# Patient Record
Sex: Male | Born: 1980 | Race: Black or African American | Hispanic: No | Marital: Single | Smoking: Current every day smoker
Health system: Southern US, Community
[De-identification: ages and names within clinical notes are randomized; demographics above are authoritative.]

---

## 2008-10-10 ENCOUNTER — Emergency Department (HOSPITAL_COMMUNITY): Admission: EM | Admit: 2008-10-10 | Discharge: 2008-10-10 | Payer: Self-pay | Admitting: Emergency Medicine

## 2011-02-08 ENCOUNTER — Emergency Department (HOSPITAL_COMMUNITY)
Admission: EM | Admit: 2011-02-08 | Discharge: 2011-02-08 | Disposition: A | Discharge status: Home / Self Care | Payer: No Typology Code available for payment source | Attending: Emergency Medicine | Admitting: Emergency Medicine

## 2011-02-08 ENCOUNTER — Encounter: Payer: Self-pay | Admitting: Emergency Medicine

## 2011-02-08 DIAGNOSIS — S335XXA Sprain of ligaments of lumbar spine, initial encounter: Secondary | ICD-10-CM | POA: Insufficient documentation

## 2011-02-08 DIAGNOSIS — F17200 Nicotine dependence, unspecified, uncomplicated: Secondary | ICD-10-CM | POA: Insufficient documentation

## 2011-02-08 DIAGNOSIS — Y92410 Unspecified street and highway as the place of occurrence of the external cause: Secondary | ICD-10-CM | POA: Insufficient documentation

## 2011-02-08 DIAGNOSIS — IMO0002 Reserved for concepts with insufficient information to code with codable children: Secondary | ICD-10-CM

## 2011-02-08 MED ORDER — OXYCODONE-ACETAMINOPHEN 5-325 MG PO TABS: 1.0000 | ORAL_TABLET | Freq: Four times a day (QID) | ORAL | Status: AC | PRN

## 2011-02-08 MED ORDER — IBUPROFEN 600 MG PO TABS: 600.0000 mg | ORAL_TABLET | Freq: Three times a day (TID) | ORAL | Status: AC | PRN

## 2011-02-08 MED ORDER — OXYCODONE-ACETAMINOPHEN 5-325 MG PO TABS
2.0000 | ORAL_TABLET | Freq: Once | ORAL | Status: AC
  Administered 2011-02-08: 2 via ORAL
  Filled 2011-02-08: qty 2

## 2011-02-08 MED ORDER — CYCLOBENZAPRINE HCL 10 MG PO TABS
10.0000 mg | ORAL_TABLET | Freq: Once | ORAL | Status: AC
  Administered 2011-02-08: 10 mg via ORAL
  Filled 2011-02-08: qty 1

## 2011-02-08 MED ORDER — CYCLOBENZAPRINE HCL 10 MG PO TABS: 10.0000 mg | ORAL_TABLET | Freq: Three times a day (TID) | ORAL | Status: AC | PRN

## 2011-02-08 NOTE — Discharge Instructions (Signed)
Back Pain & Injury Your back pain is most likely caused by a strain of the muscles or ligaments supporting the spine. Back strains cause pain and trouble moving because of muscle spasms. They may take several weeks to heal. Usually they are better in days.  Treatment for back pain includes:  Rest - Get bed rest as needed over the next day or two. Use a firm mattress and lie on your side with your knees slightly bent. If you lie on your back, put a pillow under your knees.   Early movement - Back pain improves most rapidly if you remain active. It is much more stressful on the back to sit or stand in one place. Do not sit, drive or stand in one place for more than 30 minutes at a time. Take short walks on level surfaces as soon as pain allows.   Limit bending and lifting - Do not bend over or lift anything over 20 pounds until instructed otherwise. Lift by bending your knees. Use your leg muscles to help. Keep the load close to your body and avoid twisting. Do not reach or do overhead work.   Medicines - Medicine to reduce pain and inflammation are helpful. Muscle-relaxing drugs may be prescribed.   Therapy - Put ice packs on your back every few hours for the first 2-3 days after your injury or as instructed. After that ice or heat may be alternated to reduce pain and spasm. Back exercises and gentle massage may be of some benefit. You should be examined again if your back pain is not better in one week.  SEEK IMMEDIATE MEDICAL CARE IF:  You have pain that radiates from your back into your legs.   You develop new bowel or bladder control problems.   You have unusual weakness or numbness in your arms or legs.   You develop nausea or vomiting.   You develop abdominal pain.   You feel faint.  Document Released: 06/02/2005 Document Re-Released: 03/11/2008 [facility] Patient Information 2011 [place], [place].  Back Exercises  Back exercises help treat and prevent back injuries. The goal of back  exercises is to increase the strength of your abdominal and back muscles and the flexibility of your back. These exercises should be started when you no longer have back pain. Back exercises include: 1. Pelvic Tilt - Lie on your back with your knees bent. Tilt your pelvis until the lower part of your back is against the floor. Hold this position 5-10 sec and repeat 5-10 times.  2. Knee to Chest - Pull first one knee up against your chest and hold for 20-30 seconds, repeat this with the other knee, and then both knees. This may be done with the other leg straight or bent, whichever feels better.  3. Sit-Ups or Curl-Ups - Bend your knees 90 degrees. Start with tilting your pelvis, and do a partial, slow sit-up, lifting your trunk only 30-45 degrees off the floor. Take at least 2-3 sec for each sit-up. Do not do sit-ups with your knees out straight. If partial sit-ups are difficult, simply do the above but with only tightening your abdominal muscles and holding it as directed.  4. Hip-Lift - Lie on your back with your knees flexed 90 degrees. Push down with your feet and shoulders as you raise your hips a couple inches off the floor; hold for 10 sec, repeat 5-10 times.  5. Back arches - Lie on your stomach, propping yourself up on bent elbows. Slowly  press on your hands, causing an arch in your low back. Repeat 3-5 times. Any initial stiffness and discomfort should lessen with repetition over time.  6. Shoulder-Lifts - Lie face down with arms beside your body. Keep hips and torso pressed to floor as you slowly lift your head and shoulders off the floor.  Do not overdo your exercises, especially in the beginning. Exercises may cause you some mild back discomfort which lasts for a few minutes; however, if the pain is more severe, or lasts for more than 15 minutes, do not continue exercises until you see your caregiver. Improvement with exercise therapy for back problems is slow.  See your caregivers for  assistance with developing a proper back exercise program. Document Released: 07/10/2004 Document Re-Released: 08/29/2008 [facility] Patient Information 2011 [place], [place].     You should see steady improvement in pain and symptoms gradually over the next 1-2 weeks.  If you develop weakness, difficulty with urination, fever, or sudden worsen pain, you should be re-evaluated.  Flexeril and percocet both can cause drowsiness and slowed breathing so use with caution, do not drive or operate machinery while taking them.
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
## 2014-01-02 ENCOUNTER — Emergency Department (HOSPITAL_COMMUNITY)
Admission: EM | Admit: 2014-01-02 | Discharge: 2014-01-02 | Disposition: A | Discharge status: Home / Self Care | Payer: Self-pay | Attending: Emergency Medicine | Admitting: Emergency Medicine

## 2014-01-02 ENCOUNTER — Emergency Department (HOSPITAL_COMMUNITY): Payer: Self-pay

## 2014-01-02 ENCOUNTER — Encounter (HOSPITAL_COMMUNITY): Payer: Self-pay | Admitting: Emergency Medicine

## 2014-01-02 DIAGNOSIS — Z79899 Other long term (current) drug therapy: Secondary | ICD-10-CM | POA: Insufficient documentation

## 2014-01-02 DIAGNOSIS — Y9389 Activity, other specified: Secondary | ICD-10-CM | POA: Insufficient documentation

## 2014-01-02 DIAGNOSIS — F17200 Nicotine dependence, unspecified, uncomplicated: Secondary | ICD-10-CM | POA: Insufficient documentation

## 2014-01-02 DIAGNOSIS — S63619A Unspecified sprain of unspecified finger, initial encounter: Secondary | ICD-10-CM

## 2014-01-02 DIAGNOSIS — Z880 Allergy status to penicillin: Secondary | ICD-10-CM | POA: Insufficient documentation

## 2014-01-02 DIAGNOSIS — S63639A Sprain of interphalangeal joint of unspecified finger, initial encounter: Secondary | ICD-10-CM | POA: Insufficient documentation

## 2014-01-02 DIAGNOSIS — X58XXXA Exposure to other specified factors, initial encounter: Secondary | ICD-10-CM | POA: Insufficient documentation

## 2014-01-02 DIAGNOSIS — Y929 Unspecified place or not applicable: Secondary | ICD-10-CM | POA: Insufficient documentation

## 2014-01-02 MED ORDER — IBUPROFEN 800 MG PO TABS: 800.0000 mg | ORAL_TABLET | Freq: Three times a day (TID) | ORAL | Status: DC

## 2014-01-02 NOTE — Discharge Instructions (Signed)
Your xrays are normal - ibuprofen for sprain - will get better over week - rest hand as needed

## 2014-01-02 NOTE — ED Notes (Signed)
Pt was swing at dog and injured fourth digit on left hand.

## 2014-01-02 NOTE — ED Notes (Signed)
Ice pack applied to left hand, fourth digit, to reduce swelling.

## 2014-01-02 NOTE — ED Provider Notes (Signed)
CSN: [id number]     Arrival date & time 01/02/14  0455 History   First MD Initiated Contact with Patient 01/02/14 [phone]     Chief Complaint  Patient presents with  . Finger Injury     (Consider location/radiation/quality/duration/timing/severity/associated sxs/prior Treatment) HPI Comments: 34+32 y/o male who states that just prior to arrival when he was trying to "hit my dog because he jumped on me" he hit his 4th finger of his L hand on something causing pain in the MCP - PIP area.  No swelling or deformity but pain with ROM and palpation - constant, mild, worse with ROM.  Denies prior injury to this area  The history is provided by the patient.    History reviewed. No pertinent past medical history. History reviewed. No pertinent past surgical history. Family History  Problem Relation Age of Onset  . Cancer Other   . Diabetes Other   . Hypertension Other    History  Substance Use Topics  . Smoking status: Current Every Day Smoker -- 1.00 packs/day for 10 years    Types: Cigarettes  . Smokeless tobacco: Never Used  . Alcohol Use: 7.2 oz/week    12 Cans of beer per week    Review of Systems  Gastrointestinal: Negative for nausea and vomiting.  Musculoskeletal: Negative for back pain, joint swelling and neck pain.  Neurological: Negative for weakness and numbness.      Allergies  Penicillins  Home Medications   Prior to Admission medications   Medication Sig Start Date End Date Taking? Authorizing Provider  ibuprofen (ADVIL,MOTRIN) 800 MG tablet Take 1 tablet (800 mg total) by mouth 3 (three) times daily. 01/02/14   Vida RollerBrian D Jaima Janney, MD   BP 133/84  Pulse 84  Temp(Src) 98.7 F (37.1 C) (Oral)  Resp 18  Ht 5\' 10"  (1.778 m)  Wt 160 lb (72.576 kg)  BMI 22.96 kg/m2  SpO2 99% Physical Exam  Nursing note and vitals reviewed. Constitutional: He appears well-developed and well-nourished. No distress.  HENT:  Head: Normocephalic and atraumatic.  Eyes: Conjunctivae are  normal. No scleral icterus.  Cardiovascular: Normal rate, regular rhythm and intact distal pulses.   Pulmonary/Chest: Effort normal and breath sounds normal.  Musculoskeletal: He exhibits tenderness ( over the proximal phalanx of the ring finger of the L hand, no swelling or deform). He exhibits no edema.  Normal ROM of all joints of the L and R hands  Neurological: He is alert.  Normal sensation to fingers  Skin: Skin is warm and dry. No rash noted. He is not diaphoretic.    ED Course  Procedures (including critical care time) Labs Review Labs Reviewed - No data to display  Imaging Review Dg Finger Ring Left  01/02/2014   CLINICAL DATA:  Blunt trauma to the left fourth finger, with pain at the proximal interphalangeal joint.  EXAM: LEFT RING FINGER 2+V  COMPARISON:  None.  FINDINGS: There is no evidence of fracture or dislocation. Visualized joint spaces are preserved. The fourth proximal interphalangeal joint is grossly unremarkable. No significant soft tissue abnormalities are characterized on radiograph.  IMPRESSION: No evidence of fracture or dislocation.   Electronically Signed   By: Roanna RaiderJeffery  Chang M.D.   On: 01/02/2014 05:48     MDM   Final diagnoses:  Sprain of finger, left, initial encounter    Pt concerned for frx, doubt, imaging to r/o.  Xray neg, suggestive of sprain  Meds given in ED:  Medications - No data to  display  New Prescriptions   IBUPROFEN (ADVIL,MOTRIN) 800 MG TABLET    Take 1 tablet (800 mg total) by mouth 3 (three) times daily.      Vida Roller, MD 01/02/14 [phone]

## 2014-12-20 ENCOUNTER — Emergency Department (HOSPITAL_COMMUNITY): Payer: Self-pay

## 2014-12-20 ENCOUNTER — Encounter (HOSPITAL_COMMUNITY): Payer: Self-pay | Admitting: Emergency Medicine

## 2014-12-20 ENCOUNTER — Emergency Department (HOSPITAL_COMMUNITY)
Admission: EM | Admit: 2014-12-20 | Discharge: 2014-12-20 | Disposition: A | Discharge status: Home / Self Care | Payer: Self-pay | Attending: Emergency Medicine | Admitting: Emergency Medicine

## 2014-12-20 DIAGNOSIS — W3409XA Accidental discharge from other specified firearms, initial encounter: Secondary | ICD-10-CM | POA: Insufficient documentation

## 2014-12-20 DIAGNOSIS — Z880 Allergy status to penicillin: Secondary | ICD-10-CM | POA: Insufficient documentation

## 2014-12-20 DIAGNOSIS — IMO0002 Reserved for concepts with insufficient information to code with codable children: Secondary | ICD-10-CM

## 2014-12-20 DIAGNOSIS — Z23 Encounter for immunization: Secondary | ICD-10-CM | POA: Insufficient documentation

## 2014-12-20 DIAGNOSIS — S61412A Laceration without foreign body of left hand, initial encounter: Secondary | ICD-10-CM | POA: Insufficient documentation

## 2014-12-20 DIAGNOSIS — Y9289 Other specified places as the place of occurrence of the external cause: Secondary | ICD-10-CM | POA: Insufficient documentation

## 2014-12-20 DIAGNOSIS — Y998 Other external cause status: Secondary | ICD-10-CM | POA: Insufficient documentation

## 2014-12-20 DIAGNOSIS — Y9389 Activity, other specified: Secondary | ICD-10-CM | POA: Insufficient documentation

## 2014-12-20 DIAGNOSIS — Z791 Long term (current) use of non-steroidal anti-inflammatories (NSAID): Secondary | ICD-10-CM | POA: Insufficient documentation

## 2014-12-20 DIAGNOSIS — Z720 Tobacco use: Secondary | ICD-10-CM | POA: Insufficient documentation

## 2014-12-20 MED ORDER — DICLOFENAC POTASSIUM 50 MG PO TABS: 50.0000 mg | ORAL_TABLET | Freq: Three times a day (TID) | ORAL | Status: DC

## 2014-12-20 MED ORDER — TETANUS-DIPHTH-ACELL PERTUSSIS 5-2.5-18.5 LF-MCG/0.5 IM SUSP
0.5000 mL | Freq: Once | INTRAMUSCULAR | Status: AC
  Administered 2014-12-20: 0.5 mL via INTRAMUSCULAR
  Filled 2014-12-20: qty 0.5

## 2014-12-20 MED ORDER — BACITRACIN-NEOMYCIN-POLYMYXIN 400-5-5000 EX OINT
TOPICAL_OINTMENT | Freq: Once | CUTANEOUS | Status: AC
  Administered 2014-12-20: 1 via TOPICAL
  Filled 2014-12-20: qty 1
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
## 2014-12-20 NOTE — Discharge Instructions (Signed)
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
## 2014-12-20 NOTE — ED Notes (Signed)
Pt made aware to return if symptoms worsen or if any life threatening symptoms occur.   

## 2016-04-19 ENCOUNTER — Emergency Department (HOSPITAL_COMMUNITY)
Admission: EM | Admit: 2016-04-19 | Discharge: 2016-04-19 | Disposition: A | Discharge status: Home / Self Care | Payer: Self-pay | Attending: Emergency Medicine | Admitting: Emergency Medicine

## 2016-04-19 ENCOUNTER — Encounter (HOSPITAL_COMMUNITY): Payer: Self-pay

## 2016-04-19 ENCOUNTER — Emergency Department (HOSPITAL_COMMUNITY): Payer: Self-pay

## 2016-04-19 DIAGNOSIS — S6000XA Contusion of unspecified finger without damage to nail, initial encounter: Secondary | ICD-10-CM

## 2016-04-19 DIAGNOSIS — F17210 Nicotine dependence, cigarettes, uncomplicated: Secondary | ICD-10-CM | POA: Insufficient documentation

## 2016-04-19 DIAGNOSIS — S60221A Contusion of right hand, initial encounter: Secondary | ICD-10-CM | POA: Insufficient documentation

## 2016-04-19 DIAGNOSIS — Y999 Unspecified external cause status: Secondary | ICD-10-CM | POA: Insufficient documentation

## 2016-04-19 DIAGNOSIS — X58XXXA Exposure to other specified factors, initial encounter: Secondary | ICD-10-CM | POA: Insufficient documentation

## 2016-04-19 DIAGNOSIS — W3400XA Accidental discharge from unspecified firearms or gun, initial encounter: Secondary | ICD-10-CM

## 2016-04-19 DIAGNOSIS — Y929 Unspecified place or not applicable: Secondary | ICD-10-CM | POA: Insufficient documentation

## 2016-04-19 DIAGNOSIS — Y939 Activity, unspecified: Secondary | ICD-10-CM | POA: Insufficient documentation

## 2016-04-19 DIAGNOSIS — S61412A Laceration without foreign body of left hand, initial encounter: Secondary | ICD-10-CM | POA: Insufficient documentation

## 2016-04-19 DIAGNOSIS — Z79899 Other long term (current) drug therapy: Secondary | ICD-10-CM | POA: Insufficient documentation

## 2016-04-19 MED ORDER — NAPROXEN 250 MG PO TABS
500.0000 mg | ORAL_TABLET | Freq: Once | ORAL | Status: AC
  Administered 2016-04-19: 500 mg via ORAL
  Filled 2016-04-19: qty 2

## 2016-04-19 MED ORDER — ACETAMINOPHEN 500 MG PO TABS
1000.0000 mg | ORAL_TABLET | Freq: Once | ORAL | Status: AC
  Administered 2016-04-19: 1000 mg via ORAL
  Filled 2016-04-19: qty 2

## 2016-04-19 MED ORDER — BACITRACIN ZINC 500 UNIT/GM EX OINT
TOPICAL_OINTMENT | CUTANEOUS | Status: AC
  Filled 2016-04-19: qty 0.9
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
## 2016-04-19 NOTE — Discharge Instructions (Signed)
Elevate your hand, use ice packs to keep the swelling down and for pain control. Keep the wound clean and dry, clean it with soap and water daily. Recheck for any signs of infection such as increased swelling, drains pus, gets more red. Wear the ace wrap for comfort and to help control the swelling. You can take ibuprofen 600 mg + acetaminophen 1000 mg 4 times a day for pain as needed. Follow up with Dr Romeo AppleHarrison, an orthopedist, this coming week if you continue to have a lot of pain.  Call his office to get an appointment.  Your last tetanus shot was in 2016 so it is good until 2026!!!

## 2018-06-30 ENCOUNTER — Emergency Department (HOSPITAL_COMMUNITY)
Admission: EM | Admit: 2018-06-30 | Discharge: 2018-06-30 | Disposition: A | Discharge status: Home / Self Care | Payer: Self-pay | Attending: Emergency Medicine | Admitting: Emergency Medicine

## 2018-06-30 ENCOUNTER — Encounter (HOSPITAL_COMMUNITY): Payer: Self-pay | Admitting: Emergency Medicine

## 2018-06-30 ENCOUNTER — Other Ambulatory Visit: Payer: Self-pay

## 2018-06-30 DIAGNOSIS — G9009 Other idiopathic peripheral autonomic neuropathy: Secondary | ICD-10-CM | POA: Insufficient documentation

## 2018-06-30 DIAGNOSIS — R202 Paresthesia of skin: Secondary | ICD-10-CM | POA: Insufficient documentation

## 2018-06-30 DIAGNOSIS — F17210 Nicotine dependence, cigarettes, uncomplicated: Secondary | ICD-10-CM | POA: Insufficient documentation

## 2018-06-30 NOTE — ED Triage Notes (Signed)
Patient reports intermittent numbness in his L arm that was present on awakening this am. Patient states it has happened before and usually goes away in about 10 minutes. Patient states he has had some numbness in his right arm as well. No neuro deficit at present. Strength 5/5.

## 2018-07-11 NOTE — ED Provider Notes (Signed)
[facility] EMERGENCY DEPARTMENT Provider Note   CSN: [id number] Arrival date & time: 06/30/18  1931     History   Chief Complaint Chief Complaint  Patient presents with  . Numbness    HPI Gregory Arias is a 38 y.o. male.  HPI   38 year old male with intermittent numbness in his left upper extremity.  Noticed last night when he was sleeping but figured it was the way he was positioned and went back to sleep.  He woke this morning and with the arm is numb.  He initially expected to resolve but then became concerned when it did not.  It has improved but not completely gone away.  No change in strength.  No pain.  History reviewed. No pertinent past medical history.  There are no active problems to display for this patient.   History reviewed. No pertinent surgical history.      Home Medications    Prior to Admission medications   Not on File    Family History Family History  Problem Relation Age of Onset  . Cancer Other   . Diabetes Other   . Hypertension Other     Social History Social History   Tobacco Use  . Smoking status: Current Every Day Smoker    Packs/day: 1.00    Years: 10.00    Pack years: 10.00    Types: Cigarettes  . Smokeless tobacco: Never Used  Substance Use Topics  . Alcohol use: Yes    Alcohol/week: 14.0 standard drinks    Types: 14 Cans of beer per week    Comment: every day   . Drug use: Yes    Frequency: 3.0 times per week    Types: Marijuana    Comment: yesterday     Allergies   Penicillins   Review of Systems Review of Systems  All systems reviewed and negative, other than as noted in HPI.  Physical Exam Updated Vital Signs BP (!) 131/98   Pulse (!) 57   Temp 98.5 F (36.9 C) (Oral)   Resp 18   Ht 5\' 10"  (1.778 m)   Wt 74.8 kg   SpO2 100%   BMI 23.68 kg/m   Physical Exam Vitals signs and nursing note reviewed.  Constitutional:      General: He is not in acute distress.    Appearance: He is  well-developed.  HENT:     Head: Normocephalic and atraumatic.  Eyes:     General:        Right eye: No discharge.        Left eye: No discharge.     Conjunctiva/sclera: Conjunctivae normal.  Neck:     Musculoskeletal: Neck supple.  Cardiovascular:     Rate and Rhythm: Normal rate and regular rhythm.     Heart sounds: Normal heart sounds. No murmur. No friction rub. No gallop.   Pulmonary:     Effort: Pulmonary effort is normal. No respiratory distress.     Breath sounds: Normal breath sounds.  Abdominal:     General: There is no distension.     Palpations: Abdomen is soft.     Tenderness: There is no abdominal tenderness.  Musculoskeletal:        General: No tenderness.  Skin:    General: Skin is warm and dry.  Neurological:     General: No focal deficit present.     Mental Status: He is alert and oriented to person, place, and time.  Cranial Nerves: No cranial nerve deficit.     Sensory: No sensory deficit.     Motor: No weakness.  Psychiatric:        Behavior: Behavior normal.        Thought Content: Thought content normal.      ED Treatments / Results  Labs (all labs ordered are listed, but only abnormal results are displayed) Labs Reviewed - No data to display  EKG None  Radiology No results found.  Procedures Procedures (including critical care time)  Medications Ordered in ED Medications - No data to display   Initial Impression / Assessment and Plan / ED Course  I have reviewed the triage vital signs and the nursing notes.  Pertinent labs & imaging results that were available during my care of the patient were reviewed by me and considered in my medical decision making (see chart for details).     38 year old male with left upper extremity numbness.  His neuro exam actually seems to be intact to light touch although he is reporting subjective numbness.  Consistent with peripheral neuropathy.  Symptoms have improved since he woke up this  morning.  I expect him to continue to improve.  I do not think that this is essential issue.  Reassurance provided.  Return precautions were discussed.  Final Clinical Impressions(s) / ED Diagnoses   Final diagnoses:  Paresthesias    ED Discharge Orders    None       Raeford RazorKohut, Deaundre Allston, MD 07/11/18 1531

## 2019-03-20 ENCOUNTER — Other Ambulatory Visit: Payer: Self-pay

## 2019-03-20 ENCOUNTER — Emergency Department (HOSPITAL_COMMUNITY)
Admission: EM | Admit: 2019-03-20 | Discharge: 2019-03-20 | Disposition: A | Discharge status: Home / Self Care | Payer: Self-pay | Attending: Emergency Medicine | Admitting: Emergency Medicine

## 2019-03-20 DIAGNOSIS — Y9389 Activity, other specified: Secondary | ICD-10-CM | POA: Insufficient documentation

## 2019-03-20 DIAGNOSIS — Y9289 Other specified places as the place of occurrence of the external cause: Secondary | ICD-10-CM | POA: Insufficient documentation

## 2019-03-20 DIAGNOSIS — T2652XA Corrosion of left eyelid and periocular area, initial encounter: Secondary | ICD-10-CM | POA: Insufficient documentation

## 2019-03-20 DIAGNOSIS — F17210 Nicotine dependence, cigarettes, uncomplicated: Secondary | ICD-10-CM | POA: Insufficient documentation

## 2019-03-20 DIAGNOSIS — Y990 Civilian activity done for income or pay: Secondary | ICD-10-CM | POA: Insufficient documentation

## 2019-03-20 DIAGNOSIS — T550X1A Toxic effect of soaps, accidental (unintentional), initial encounter: Secondary | ICD-10-CM | POA: Insufficient documentation

## 2019-03-20 DIAGNOSIS — X58XXXA Exposure to other specified factors, initial encounter: Secondary | ICD-10-CM | POA: Insufficient documentation

## 2019-03-20 MED ORDER — POLYMYXIN B-TRIMETHOPRIM 10000-0.1 UNIT/ML-% OP SOLN
1.0000 [drp] | Freq: Four times a day (QID) | OPHTHALMIC | Status: DC
  Administered 2019-03-20: 22:00:00 1 [drp] via OPHTHALMIC
  Filled 2019-03-20: qty 10

## 2019-03-20 MED ORDER — FLUORESCEIN SODIUM 1 MG OP STRP
1.0000 | ORAL_STRIP | Freq: Once | OPHTHALMIC | Status: AC
  Administered 2019-03-20: 21:00:00 1 via OPHTHALMIC
  Filled 2019-03-20: qty 1

## 2019-03-20 MED ORDER — TETRACAINE HCL 0.5 % OP SOLN
1.0000 [drp] | Freq: Once | OPHTHALMIC | Status: AC
  Administered 2019-03-20: 1 [drp] via OPHTHALMIC
  Filled 2019-03-20: qty 4

## 2019-03-20 NOTE — Discharge Instructions (Signed)
Continue to use moisturizing lotion to affected skin.  Apply 1-2 drops of eye drop to both eyes 3-4 times daily for 5 days to decrease risk of eye infection.  Change your contact lens at your earliest convenient.
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
## 2019-03-20 NOTE — ED Notes (Addendum)
Informed pt awaiting eyedrops from [facility]. Pt verbalized understanding.
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
## 2020-12-03 ENCOUNTER — Encounter (HOSPITAL_COMMUNITY): Payer: Self-pay | Admitting: *Deleted

## 2020-12-03 ENCOUNTER — Emergency Department (HOSPITAL_COMMUNITY): Payer: Self-pay

## 2020-12-03 ENCOUNTER — Emergency Department (HOSPITAL_COMMUNITY)
Admission: EM | Admit: 2020-12-03 | Discharge: 2020-12-03 | Disposition: A | Discharge status: Home / Self Care | Payer: Self-pay | Attending: Emergency Medicine | Admitting: Emergency Medicine

## 2020-12-03 ENCOUNTER — Other Ambulatory Visit: Payer: Self-pay

## 2020-12-03 DIAGNOSIS — S2241XA Multiple fractures of ribs, right side, initial encounter for closed fracture: Secondary | ICD-10-CM | POA: Insufficient documentation

## 2020-12-03 DIAGNOSIS — R109 Unspecified abdominal pain: Secondary | ICD-10-CM | POA: Insufficient documentation

## 2020-12-03 DIAGNOSIS — R059 Cough, unspecified: Secondary | ICD-10-CM | POA: Insufficient documentation

## 2020-12-03 DIAGNOSIS — F17210 Nicotine dependence, cigarettes, uncomplicated: Secondary | ICD-10-CM | POA: Insufficient documentation

## 2020-12-03 LAB — CBC WITH DIFFERENTIAL/PLATELET
Abs Immature Granulocytes: 0.03 10*3/uL (ref 0.00–0.07)
Basophils Absolute: 0.1 10*3/uL (ref 0.0–0.1)
Basophils Relative: 1 %
Eosinophils Absolute: 0.1 10*3/uL (ref 0.0–0.5)
Eosinophils Relative: 2 %
HCT: 42.9 % (ref 39.0–52.0)
Hemoglobin: 14.6 g/dL (ref 13.0–17.0)
Immature Granulocytes: 0 %
Lymphocytes Relative: 24 %
Lymphs Abs: 2 10*3/uL (ref 0.7–4.0)
MCH: 33 pg (ref 26.0–34.0)
MCHC: 34 g/dL (ref 30.0–36.0)
MCV: 96.8 fL (ref 80.0–100.0)
Monocytes Absolute: 0.7 10*3/uL (ref 0.1–1.0)
Monocytes Relative: 8 %
Neutro Abs: 5.4 10*3/uL (ref 1.7–7.7)
Neutrophils Relative %: 65 %
Platelets: 252 10*3/uL (ref 150–400)
RBC: 4.43 MIL/uL (ref 4.22–5.81)
RDW: 13 % (ref 11.5–15.5)
WBC: 8.3 10*3/uL (ref 4.0–10.5)
nRBC: 0 % (ref 0.0–0.2)

## 2020-12-03 LAB — BASIC METABOLIC PANEL
Anion gap: 12 (ref 5–15)
BUN: 13 mg/dL (ref 6–20)
CO2: 24 mmol/L (ref 22–32)
Calcium: 8.9 mg/dL (ref 8.9–10.3)
Chloride: 101 mmol/L (ref 98–111)
Creatinine, Ser: 1.07 mg/dL (ref 0.61–1.24)
GFR, Estimated: >60 mL/min (ref >60)
Glucose, Bld: 85 mg/dL (ref 70–99)
Potassium: 4.1 mmol/L (ref 3.5–5.1)
Sodium: 137 mmol/L (ref 135–145)

## 2020-12-03 MED ORDER — HYDROCODONE-ACETAMINOPHEN 5-325 MG PO TABS
1.0000 | ORAL_TABLET | Freq: Once | ORAL | Status: AC
  Administered 2020-12-03: 1 via ORAL
  Filled 2020-12-03: qty 1

## 2020-12-03 MED ORDER — IOHEXOL 300 MG/ML  SOLN
100.0000 mL | Freq: Once | INTRAMUSCULAR | Status: AC | PRN
  Administered 2020-12-03: 100 mL via INTRAVENOUS

## 2020-12-03 MED ORDER — HYDROCODONE-ACETAMINOPHEN 5-325 MG PO TABS: 1.0000 | ORAL_TABLET | ORAL | 0 refills | Status: AC | PRN

## 2020-12-03 NOTE — Discharge Instructions (Addendum)
You can take Tylenol or Ibuprofen as directed for pain. You can alternate Tylenol and Ibuprofen every 4 hours. If you take Tylenol at 1pm, then you can take Ibuprofen at 5pm. Then you can take Tylenol again at 9pm.   Take pain medications as directed for break through pain. Do not drive or operate machinery while taking this medication.   Use the incentive spirometer.  Return the emergency department for any worsening pain, difficulty breathing, fevers or any other worsening concerning symptoms.
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
## 2023-04-25 ENCOUNTER — Emergency Department (HOSPITAL_COMMUNITY)
Admission: EM | Admit: 2023-04-25 | Discharge: 2023-04-25 | Disposition: A | Discharge status: Home / Self Care | Payer: Self-pay | Attending: Emergency Medicine | Admitting: Emergency Medicine

## 2023-04-25 ENCOUNTER — Other Ambulatory Visit: Payer: Self-pay

## 2023-04-25 ENCOUNTER — Encounter (HOSPITAL_COMMUNITY): Payer: Self-pay | Admitting: Emergency Medicine

## 2023-04-25 DIAGNOSIS — T7840XA Allergy, unspecified, initial encounter: Secondary | ICD-10-CM | POA: Insufficient documentation

## 2023-04-25 MED ORDER — FAMOTIDINE 20 MG PO TABS: 20.0000 mg | ORAL_TABLET | Freq: Two times a day (BID) | ORAL | 0 refills | Status: AC

## 2023-04-25 MED ORDER — ONDANSETRON 4 MG PO TBDP: 4.0000 mg | ORAL_TABLET | Freq: Three times a day (TID) | ORAL | 0 refills | Status: AC | PRN

## 2023-04-25 MED ORDER — PREDNISONE 10 MG PO TABS: 40.0000 mg | ORAL_TABLET | Freq: Every day | ORAL | 0 refills | Status: AC

## 2023-04-25 MED ORDER — METHYLPREDNISOLONE SODIUM SUCC 125 MG IJ SOLR
125.0000 mg | Freq: Once | INTRAMUSCULAR | Status: AC
  Administered 2023-04-25: 125 mg via INTRAVENOUS
  Filled 2023-04-25: qty 2

## 2023-04-25 MED ORDER — ONDANSETRON HCL 4 MG/2ML IJ SOLN
4.0000 mg | Freq: Once | INTRAMUSCULAR | Status: AC
  Administered 2023-04-25: 4 mg via INTRAVENOUS
  Filled 2023-04-25: qty 2

## 2023-04-25 MED ORDER — DIPHENHYDRAMINE HCL 50 MG/ML IJ SOLN
25.0000 mg | Freq: Once | INTRAMUSCULAR | Status: AC
  Administered 2023-04-25: 25 mg via INTRAVENOUS
  Filled 2023-04-25: qty 1

## 2023-04-25 MED ORDER — FAMOTIDINE IN NACL 20-0.9 MG/50ML-% IV SOLN
20.0000 mg | Freq: Once | INTRAVENOUS | Status: AC
  Administered 2023-04-25: 20 mg via INTRAVENOUS
  Filled 2023-04-25: qty 50

## 2023-04-25 NOTE — ED Triage Notes (Signed)
Pt states he was at work when he suddenly started itching all over and he vomited once. Pt states he took a Benadryl but that he vomited "ten mins later".

## 2023-04-25 NOTE — Discharge Instructions (Signed)
Take the prednisone as directed for the next 5 days.  Take the Pepcid as directed for the next 7 days.  You can continue to take Benadryl 25 mg every 6 hours for the next 24 hours.  Also prescription provided for Zofran and ODT if the nausea persists.  If the diarrhea persists you can take over-the-counter Imodium A-D.  Work note provided.  Prescription for Benadryl not provided.  You can get that over-the-counter.  Return for any new or worse symptoms.  Particularly any tongue swelling lip swelling difficulty breathing or difficulty swallowing.

## 2023-04-25 NOTE — ED Provider Notes (Addendum)
[place] EMERGENCY DEPARTMENT AT [facility] Provider Note   CSN: [id number] Arrival date & time: 04/25/23  4098     History  Chief Complaint  Patient presents with   Pruritis    Gregory Arias is a 42 y.o. male.  Patient works overnight.  Did have a microwave dinner.  That was sort of steak and mushroom.  Shortly after that he got kind of a tingling feeling in his lips.  He started itching all over.  Said he had welts.  He vomited once.  Since then he has had some loose bowel movements.  No tongue swelling no trouble breathing no trouble swallowing.  Patient took a Benadryl but he vomited about 10 minutes later.  Went home and washed off because he thought maybe something got on his skin.  That did not make any difference.  Past medical history noncontributory patient is an everyday smoker.  Patient denied any blood in the vomit or diarrhea.  No known sick exposure.       Home Medications Prior to Admission medications   Medication Sig Start Date End Date Taking? Authorizing Provider  HYDROcodone-acetaminophen (NORCO/VICODIN) 5-325 MG tablet Take 1-2 tablets by mouth every 4 (four) hours as needed. 12/03/20   Maxwell Caul, PA-C      Allergies    Penicillins    Review of Systems   Review of Systems  Constitutional:  Negative for chills and fever.  HENT:  Negative for ear pain, facial swelling, sore throat and trouble swallowing.   Eyes:  Negative for pain and visual disturbance.  Respiratory:  Negative for cough, shortness of breath and wheezing.   Cardiovascular:  Negative for chest pain and palpitations.  Gastrointestinal:  Positive for diarrhea and vomiting. Negative for abdominal pain.  Genitourinary:  Negative for dysuria and hematuria.  Musculoskeletal:  Negative for arthralgias and back pain.  Skin:  Positive for rash. Negative for color change.  Neurological:  Negative for seizures and syncope.  All other systems reviewed and are  negative.   Physical Exam Updated Vital Signs BP (!) 162/99 (BP Location: Left Arm)   Pulse 81   Temp 98.2 F (36.8 C) (Oral)   Resp 18   Ht 1.778 m (5\' 10" )   Wt 70.3 kg   SpO2 99%   BMI 22.24 kg/m  Physical Exam Vitals and nursing note reviewed.  Constitutional:      General: He is not in acute distress.    Appearance: Normal appearance. He is well-developed.  HENT:     Head: Normocephalic and atraumatic.     Mouth/Throat:     Pharynx: Oropharynx is clear.     Comments: No tongue or lip swelling. Eyes:     Extraocular Movements: Extraocular movements intact.     Conjunctiva/sclera: Conjunctivae normal.     Pupils: Pupils are equal, round, and reactive to light.  Cardiovascular:     Rate and Rhythm: Normal rate and regular rhythm.     Heart sounds: No murmur heard. Pulmonary:     Effort: Pulmonary effort is normal. No respiratory distress.     Breath sounds: Normal breath sounds. No wheezing.  Abdominal:     Palpations: Abdomen is soft.     Tenderness: There is no abdominal tenderness.  Musculoskeletal:        General: No swelling.     Cervical back: Normal range of motion and neck supple. No rigidity.  Skin:    General: Skin is warm and dry.  Capillary Refill: Capillary refill takes less than 2 seconds.     Findings: Rash present.     Comments: Some welts to the thigh area.  Neurological:     General: No focal deficit present.     Mental Status: He is alert and oriented to person, place, and time.  Psychiatric:        Mood and Affect: Mood normal.     ED Results / Procedures / Treatments   Labs (all labs ordered are listed, but only abnormal results are displayed) Labs Reviewed - No data to display  EKG None  Radiology No results found.  Procedures Procedures    Medications Ordered in ED Medications  ondansetron (ZOFRAN) injection 4 mg (has no administration in time range)  famotidine (PEPCID) IVPB 20 mg premix (has no administration in  time range)  diphenhydrAMINE (BENADRYL) injection 25 mg (has no administration in time range)  methylPREDNISolone sodium succinate (SOLU-MEDROL) 125 mg/2 mL injection 125 mg (has no administration in time range)    ED Course/ Medical Decision Making/ A&P                                 Medical Decision Making Risk Prescription drug management.   Clinically suspect that this is may be a food allergy.  Also possible could be onset of a gastroenteritis and that is causing some immune response to the skin itching.  Will go ahead and treat with IV Solu-Medrol Pepcid and Benadryl and reassess.  Patient feeling much better following the IV medicines.  Itching is resolved.  Diarrhea also has not reoccurred.  Suspect this was a food allergy.  Patient stable for discharge home no trouble breathing no tongue or lip swelling.  Will have patient continue Pepcid Benadryl and prednisone at home and will have him out of work for the next 2 days.  He will return for any new or worse symptoms.  Patient eats red meat normally without any difficulty.  Patient does talk about a tick bite year ago.  Doubt that there is a connection but did warn him that if every time he eats red meat this starts to recur.  Could be tick bite as the cause.   Final Clinical Impression(s) / ED Diagnoses Final diagnoses:  Allergic reaction, initial encounter    Rx / DC Orders ED Discharge Orders     None         Vanetta Mulders, MD 04/25/23 1610    Vanetta Mulders, MD 04/25/23 [phone]

## 2024-03-14 ENCOUNTER — Emergency Department (HOSPITAL_COMMUNITY): Admission: EM | Admit: 2024-03-14 | Discharge: 2024-03-14 | Discharge status: Against Medical Advice | Payer: Self-pay

## 2024-03-14 ENCOUNTER — Encounter (HOSPITAL_COMMUNITY): Payer: Self-pay

## 2024-03-14 NOTE — ED Notes (Signed)
 After beginning Triage, Pt reports he does not need to be seen today and his rash  is already going away. Pt walked out of [facility] and verbalized understanding of risks of leaving w/o being evaluated. Pt advised to return for treatment if symptoms worsened.
